# Patient Record
Sex: Male | Born: 2005 | Hispanic: No | Marital: Single | Smoking: Never smoker
Health system: Southern US, Community
[De-identification: ages and names within clinical notes are randomized; demographics above are authoritative.]

## PROBLEM LIST (undated history)

## (undated) DIAGNOSIS — F819 Developmental disorder of scholastic skills, unspecified: Secondary | ICD-10-CM

## (undated) DIAGNOSIS — F8189 Other developmental disorders of scholastic skills: Secondary | ICD-10-CM

## (undated) DIAGNOSIS — Z8709 Personal history of other diseases of the respiratory system: Secondary | ICD-10-CM

## (undated) DIAGNOSIS — Z9289 Personal history of other medical treatment: Secondary | ICD-10-CM

## (undated) DIAGNOSIS — K589 Irritable bowel syndrome without diarrhea: Secondary | ICD-10-CM

## (undated) DIAGNOSIS — F419 Anxiety disorder, unspecified: Secondary | ICD-10-CM

## (undated) DIAGNOSIS — F429 Obsessive-compulsive disorder, unspecified: Secondary | ICD-10-CM

## (undated) DIAGNOSIS — F909 Attention-deficit hyperactivity disorder, unspecified type: Secondary | ICD-10-CM

## (undated) DIAGNOSIS — R4689 Other symptoms and signs involving appearance and behavior: Secondary | ICD-10-CM

## (undated) DIAGNOSIS — F840 Autistic disorder: Secondary | ICD-10-CM

## (undated) DIAGNOSIS — R21 Rash and other nonspecific skin eruption: Secondary | ICD-10-CM

## (undated) DIAGNOSIS — Z87898 Personal history of other specified conditions: Secondary | ICD-10-CM

## (undated) DIAGNOSIS — Z9229 Personal history of other drug therapy: Secondary | ICD-10-CM

## (undated) HISTORY — DX: Developmental disorder of scholastic skills, unspecified: F81.9

## (undated) HISTORY — DX: Autistic disorder: F84.0

## (undated) HISTORY — DX: Obsessive-compulsive disorder, unspecified: F42.9

## (undated) HISTORY — PX: TONSILLECTOMY AND ADENOIDECTOMY: SUR1326

## (undated) HISTORY — PX: DENTAL RESTORATION/EXTRACTION WITH X-RAY: SHX5796

## (undated) HISTORY — DX: Attention-deficit hyperactivity disorder, unspecified type: F90.9

---
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
## 2024-01-09 ENCOUNTER — Encounter: Payer: Self-pay | Admitting: *Deleted

## 2024-02-27 ENCOUNTER — Ambulatory Visit: Payer: MEDICAID | Admitting: Nurse Practitioner

## 2024-02-27 ENCOUNTER — Encounter: Payer: MEDICAID | Admitting: Family Medicine
